# Patient Record
Sex: Male | Born: 2000 | Race: Black or African American | Hispanic: No | Marital: Single | Smoking: Never smoker
Health system: Southern US, Community
[De-identification: ages and names within clinical notes are randomized; demographics above are authoritative.]

## PROBLEM LIST (undated history)

## (undated) DIAGNOSIS — E119 Type 2 diabetes mellitus without complications: Secondary | ICD-10-CM

---
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
## 2021-09-13 IMAGING — CR DG CHEST 2V
2 series · 2 of 2 positions shown · non-contrast
Comparison: None.

CLINICAL DATA: Intermittent chest pain on the left without
shortness of breath

EXAM:
CHEST - 2 VIEW

[chest pa]
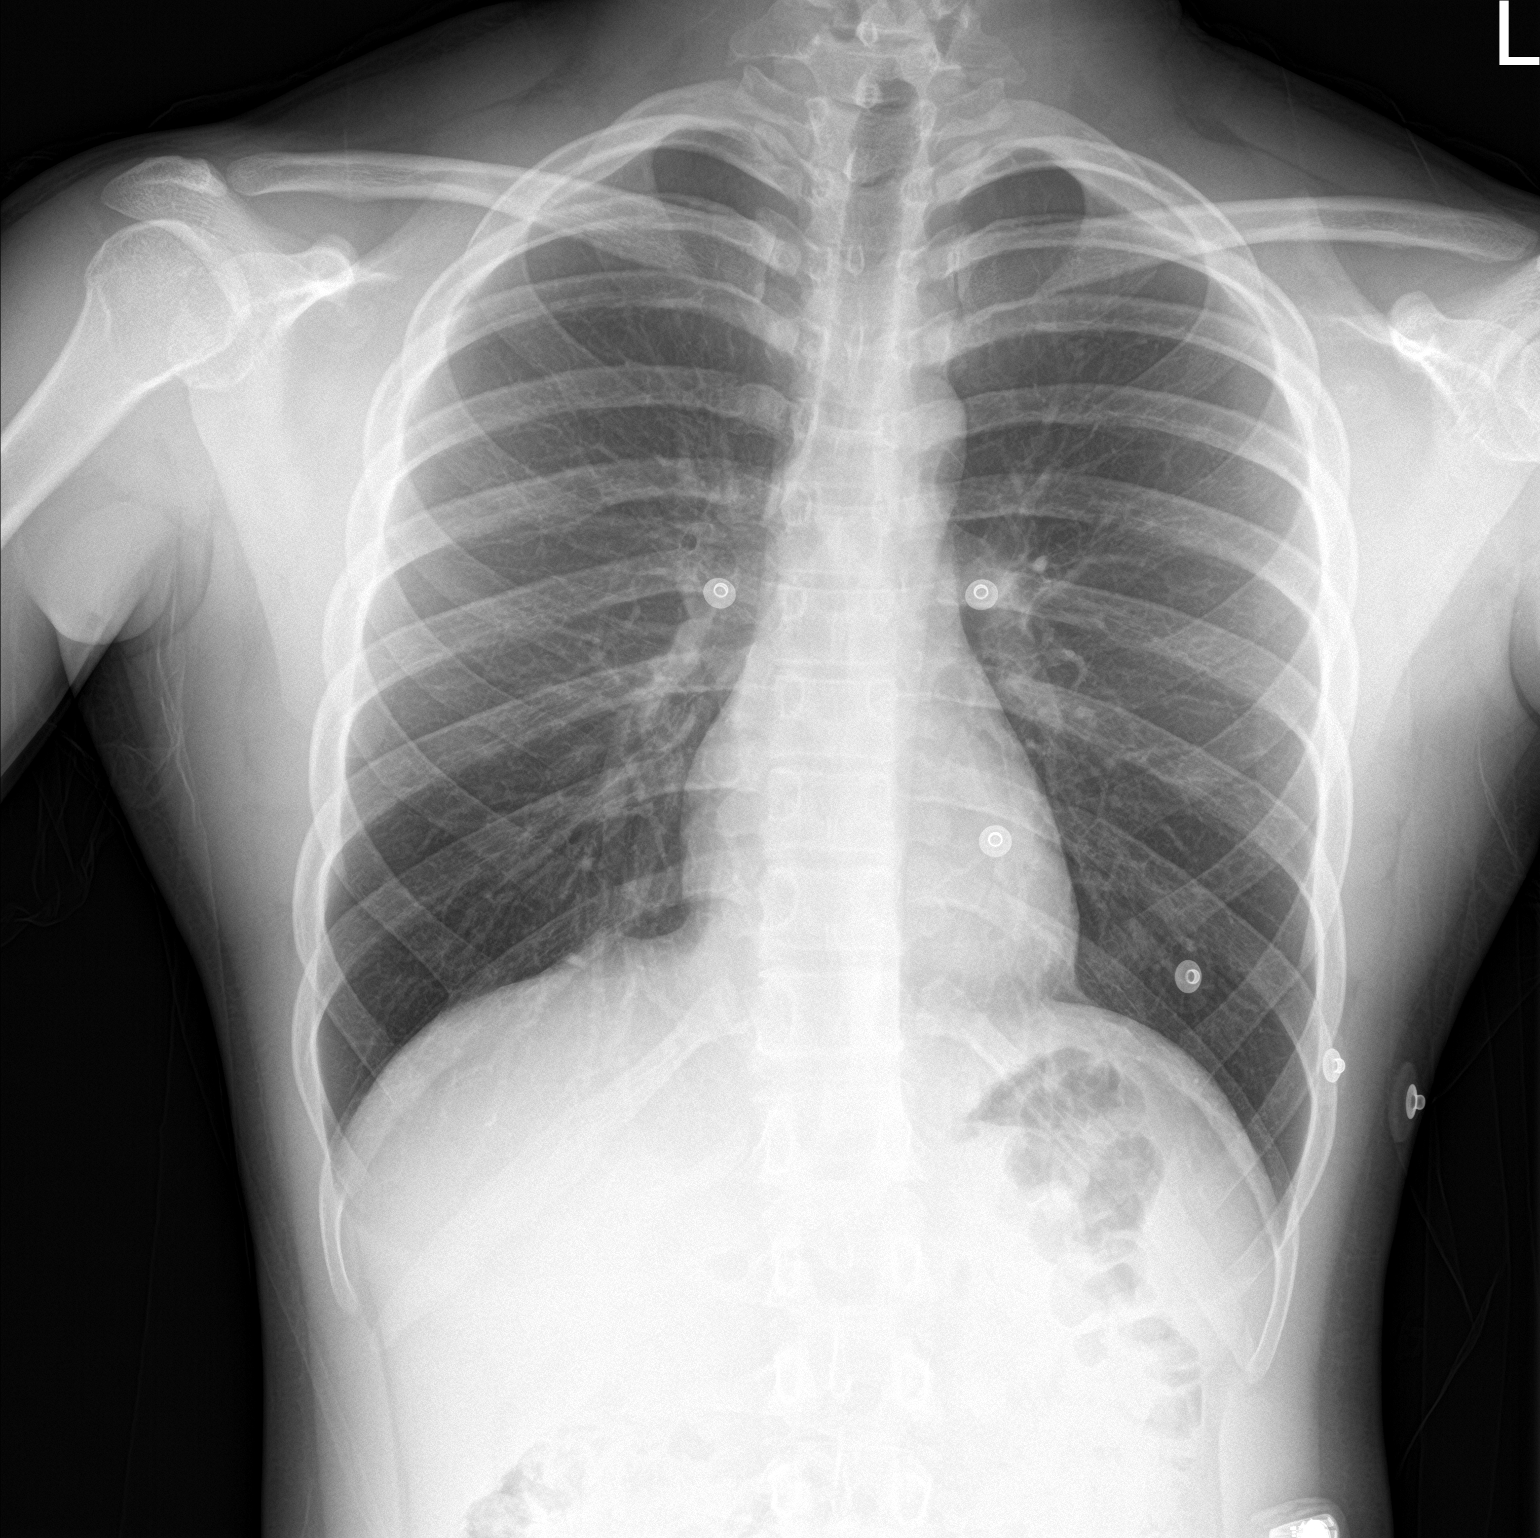

[chest lat]
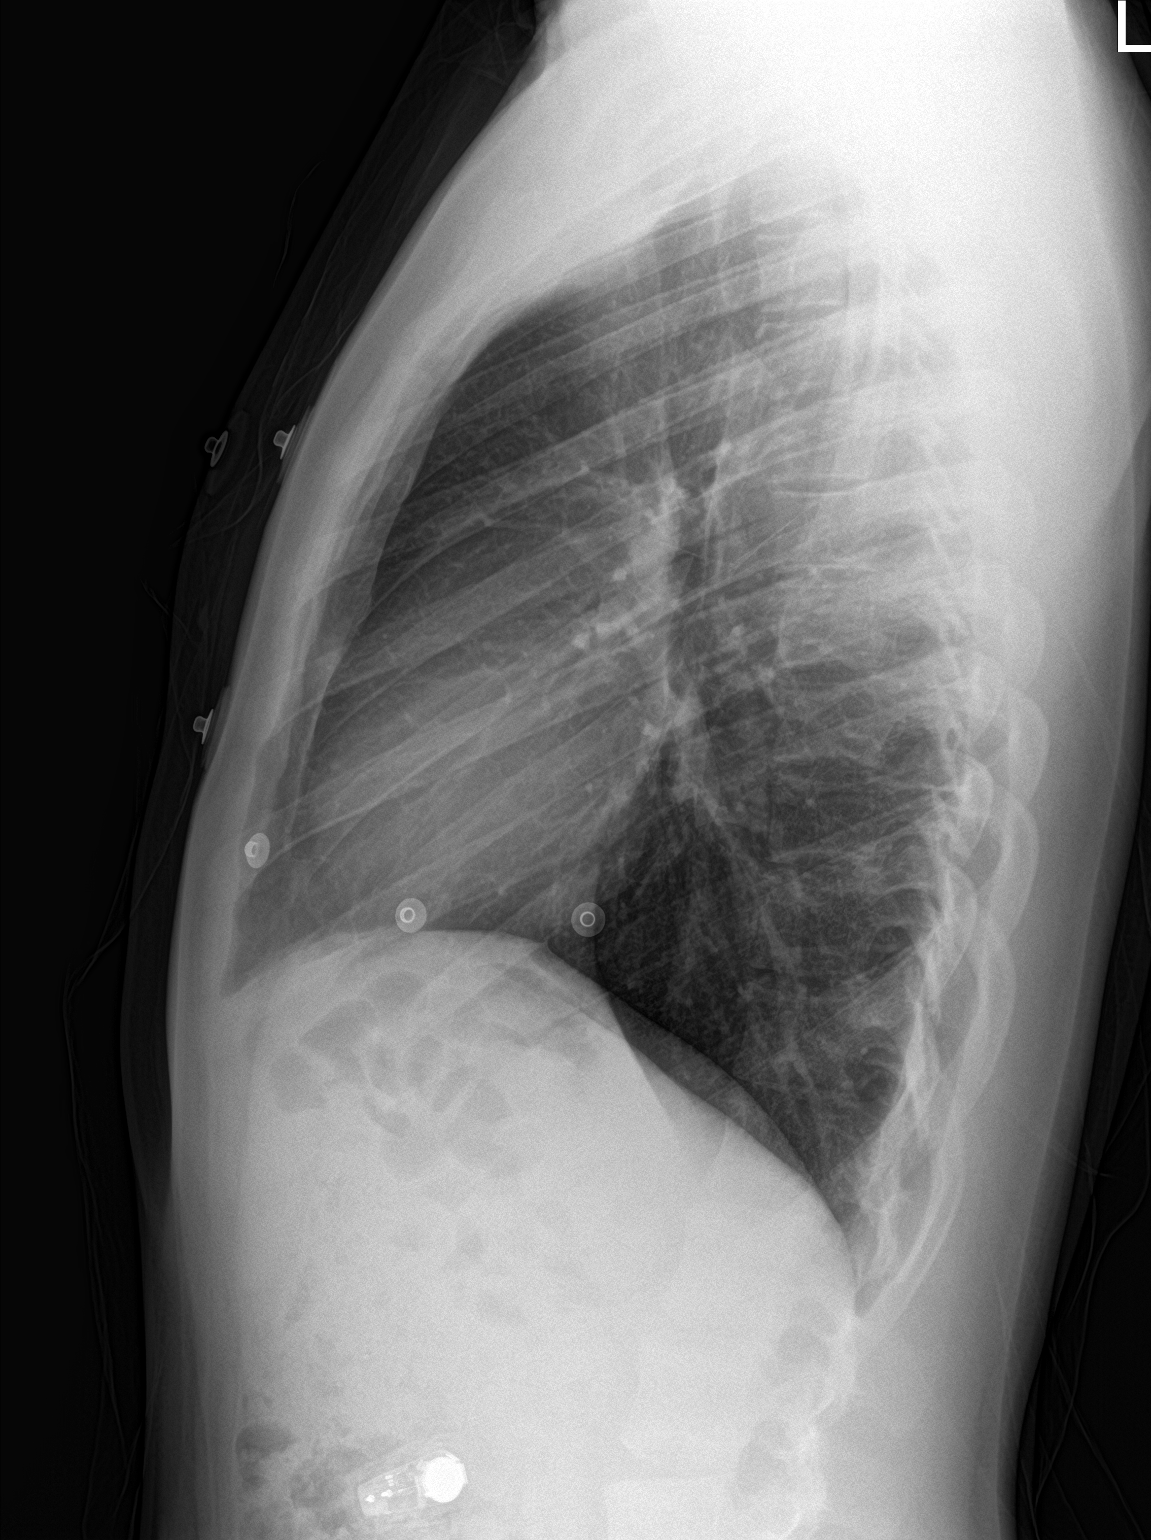

[2 of 2 positions shown; findings below may reference images not displayed]

FINDINGS: The heart size and mediastinal contours are within normal limits.
Both lungs are clear. The visualized skeletal structures are
unremarkable.
IMPRESSION: No active cardiopulmonary disease.
